# Patient Record
Sex: Female | Born: 1955 | Race: White | Hispanic: No | Marital: Married
Health system: Southern US, Community
[De-identification: ages and names within clinical notes are randomized; demographics above are authoritative.]

---

## 2005-02-23 ENCOUNTER — Ambulatory Visit: Payer: Self-pay | Admitting: Emergency Medicine

## 2005-02-23 ENCOUNTER — Emergency Department: Payer: Self-pay | Admitting: Emergency Medicine

## 2006-07-20 ENCOUNTER — Ambulatory Visit (HOSPITAL_COMMUNITY): Admission: RE | Admit: 2006-07-20 | Discharge: 2006-07-20 | Payer: Self-pay | Admitting: *Deleted

## 2006-09-03 ENCOUNTER — Observation Stay (HOSPITAL_COMMUNITY): Admission: EM | Admit: 2006-09-03 | Discharge: 2006-09-04 | Payer: Self-pay | Admitting: Emergency Medicine

## 2006-09-12 ENCOUNTER — Ambulatory Visit: Payer: Self-pay | Admitting: Gastroenterology

## 2006-09-13 ENCOUNTER — Observation Stay (HOSPITAL_COMMUNITY): Admission: RE | Admit: 2006-09-13 | Discharge: 2006-09-14 | Payer: Self-pay | Admitting: Cardiology

## 2007-01-11 ENCOUNTER — Inpatient Hospital Stay (HOSPITAL_COMMUNITY): Admission: EM | Admit: 2007-01-11 | Discharge: 2007-01-12 | Payer: Self-pay | Admitting: Emergency Medicine

## 2007-02-04 ENCOUNTER — Inpatient Hospital Stay (HOSPITAL_COMMUNITY): Admission: EM | Admit: 2007-02-04 | Discharge: 2007-02-06 | Payer: Self-pay | Admitting: Emergency Medicine

## 2007-02-04 ENCOUNTER — Ambulatory Visit: Payer: Self-pay | Admitting: Internal Medicine

## 2007-05-04 ENCOUNTER — Ambulatory Visit: Payer: Self-pay | Admitting: Emergency Medicine

## 2007-06-27 ENCOUNTER — Inpatient Hospital Stay: Payer: Self-pay | Admitting: Cardiology

## 2007-07-15 ENCOUNTER — Ambulatory Visit: Payer: Self-pay | Admitting: Emergency Medicine

## 2007-07-27 ENCOUNTER — Inpatient Hospital Stay: Payer: Self-pay | Admitting: Internal Medicine

## 2007-07-27 ENCOUNTER — Other Ambulatory Visit: Payer: Self-pay

## 2007-08-15 ENCOUNTER — Other Ambulatory Visit: Payer: Self-pay

## 2007-08-16 ENCOUNTER — Inpatient Hospital Stay: Payer: Self-pay | Admitting: Internal Medicine

## 2007-12-04 ENCOUNTER — Ambulatory Visit: Payer: Self-pay | Admitting: Family Medicine

## 2008-01-18 ENCOUNTER — Emergency Department: Payer: Self-pay | Admitting: Emergency Medicine

## 2008-01-18 ENCOUNTER — Other Ambulatory Visit: Payer: Self-pay

## 2008-02-25 ENCOUNTER — Ambulatory Visit: Payer: Self-pay | Admitting: Pain Medicine

## 2008-03-02 ENCOUNTER — Ambulatory Visit: Payer: Self-pay | Admitting: Pain Medicine

## 2008-03-04 ENCOUNTER — Ambulatory Visit: Payer: Self-pay | Admitting: Pain Medicine

## 2008-04-07 ENCOUNTER — Ambulatory Visit: Payer: Self-pay | Admitting: Pain Medicine

## 2008-04-15 ENCOUNTER — Ambulatory Visit: Payer: Self-pay | Admitting: Pain Medicine

## 2008-08-04 ENCOUNTER — Ambulatory Visit: Payer: Self-pay | Admitting: Pain Medicine

## 2008-08-12 ENCOUNTER — Ambulatory Visit: Payer: Self-pay | Admitting: Pain Medicine

## 2008-08-31 ENCOUNTER — Ambulatory Visit: Payer: Self-pay | Admitting: Vascular Surgery

## 2009-08-19 ENCOUNTER — Ambulatory Visit: Payer: Self-pay | Admitting: Vascular Surgery

## 2009-09-03 ENCOUNTER — Ambulatory Visit: Payer: Self-pay | Admitting: Vascular Surgery

## 2009-09-07 ENCOUNTER — Inpatient Hospital Stay: Payer: Self-pay | Admitting: Vascular Surgery

## 2009-09-16 ENCOUNTER — Inpatient Hospital Stay: Payer: Self-pay | Admitting: Vascular Surgery

## 2009-12-30 ENCOUNTER — Inpatient Hospital Stay: Payer: Self-pay | Admitting: Vascular Surgery

## 2010-07-28 ENCOUNTER — Inpatient Hospital Stay: Payer: Self-pay | Admitting: Vascular Surgery

## 2011-01-20 ENCOUNTER — Ambulatory Visit: Payer: Self-pay | Admitting: Vascular Surgery

## 2011-01-26 ENCOUNTER — Inpatient Hospital Stay: Payer: Self-pay | Admitting: Vascular Surgery

## 2011-04-18 ENCOUNTER — Inpatient Hospital Stay: Payer: Self-pay | Admitting: Vascular Surgery

## 2011-04-24 ENCOUNTER — Inpatient Hospital Stay: Payer: Self-pay | Admitting: Vascular Surgery

## 2011-04-28 LAB — PATHOLOGY REPORT

## 2011-07-07 ENCOUNTER — Inpatient Hospital Stay: Payer: Self-pay | Admitting: Vascular Surgery

## 2011-08-02 IMAGING — XA IR VASCULAR PROCEDURE
9 series · 15 of 17 positions shown · IV contrast (IODINE)
Comparison: none

[Series 1: aorta · 2 of 2 slices shown (1 of 8)]
[im 1/2]
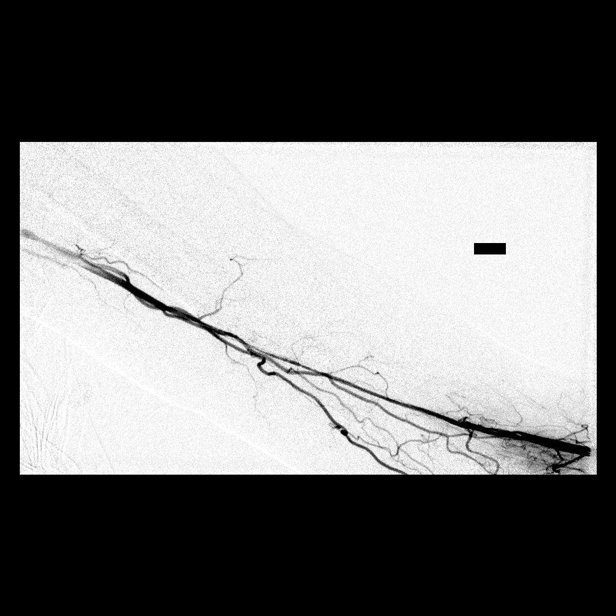
[im 2/2]
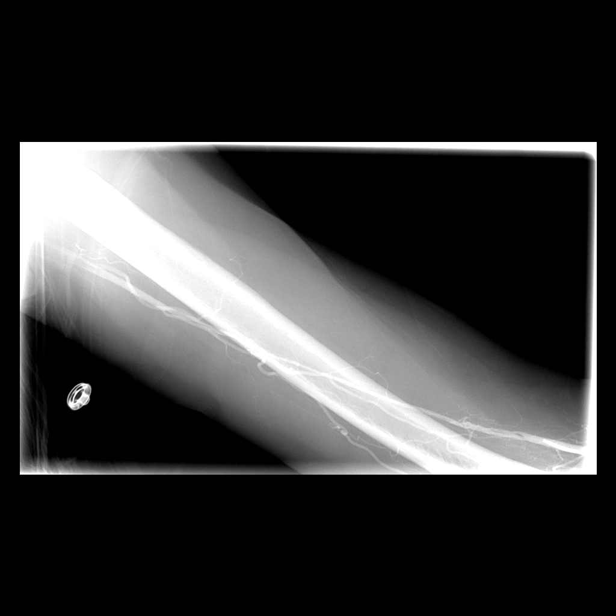

[Series 2: fl  angio · 1 of 1 slices shown]
[im 1/1]
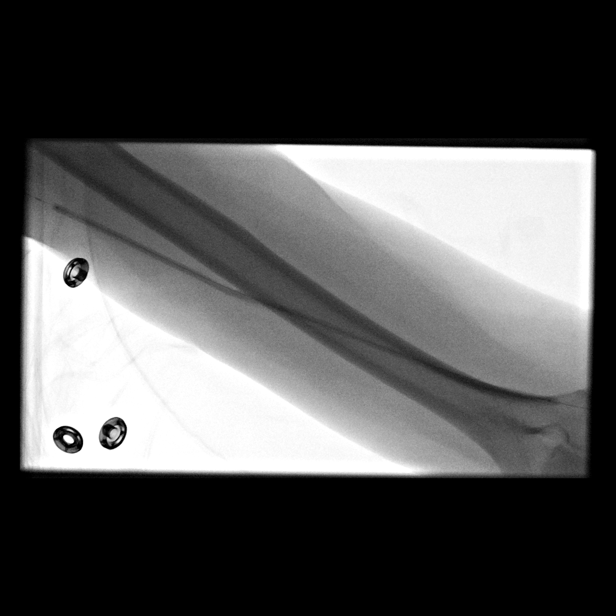

[Series 4: aorta · 1 of 2 slices shown (2 of 8)]
[im 1/2]
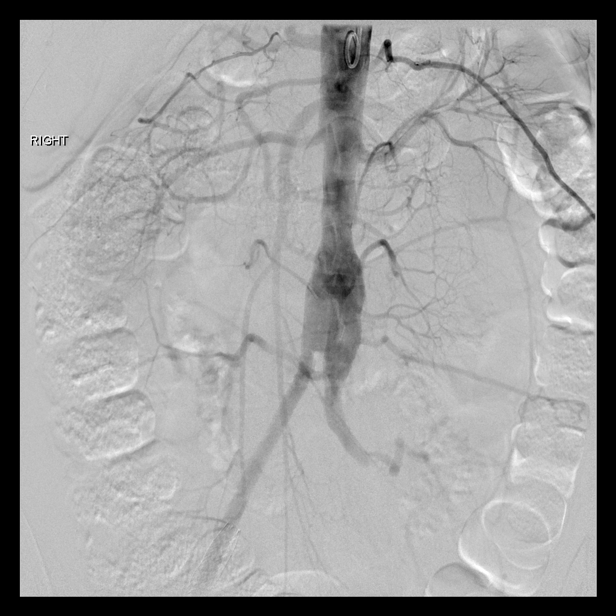

[Series 5: aorta · 2 of 2 slices shown (3 of 8)]
[im 1/2]
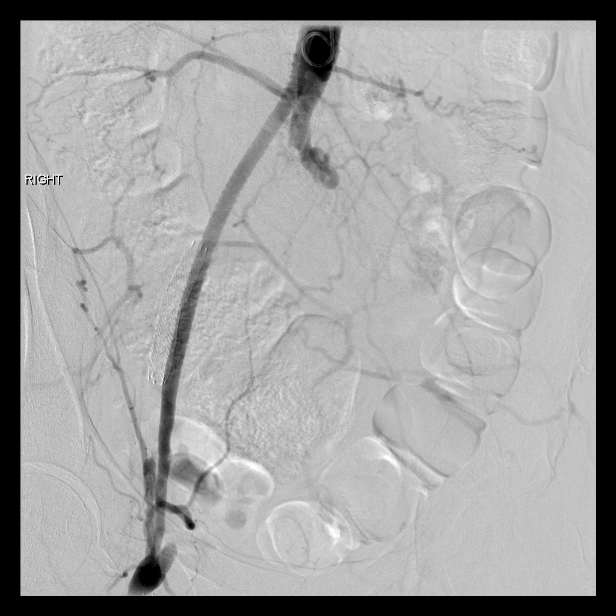
[im 2/2]
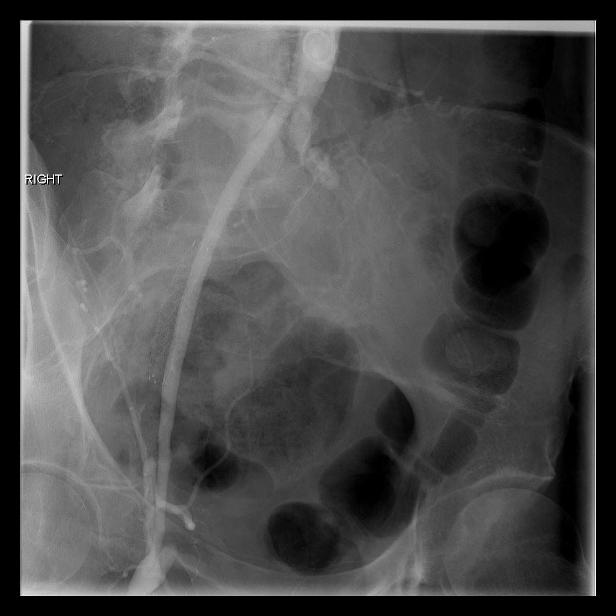

[Series 6: aorta · 2 of 2 slices shown (4 of 8)]
[im 1/2]
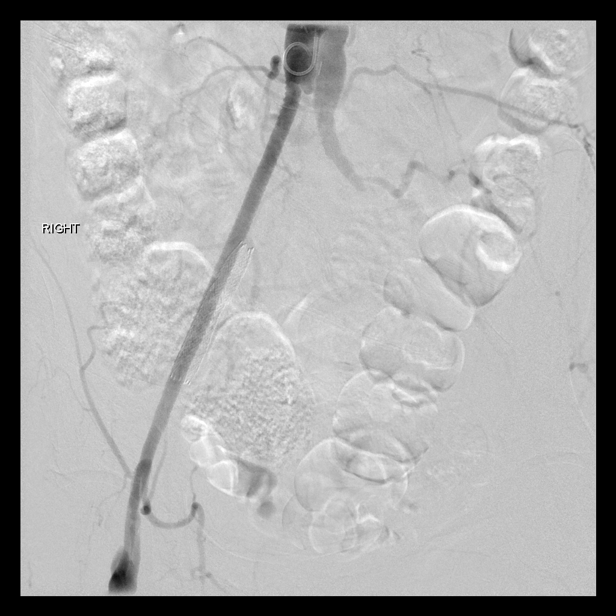
[im 2/2]
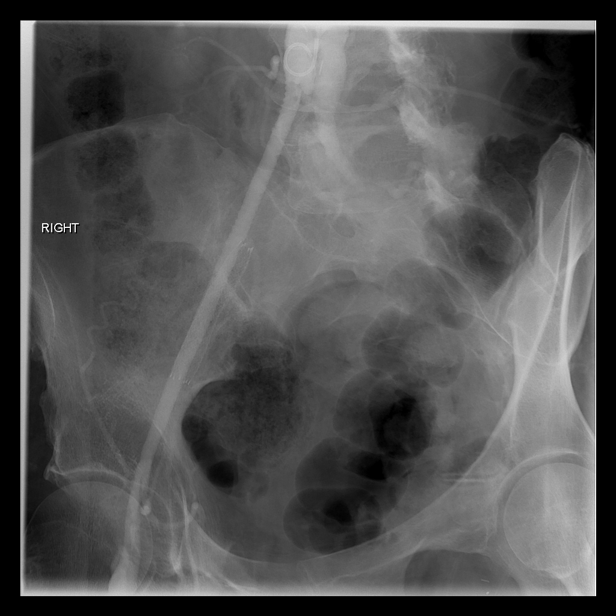

[Series 7: aorta · 2 of 2 slices shown (5 of 8)]
[im 1/2]
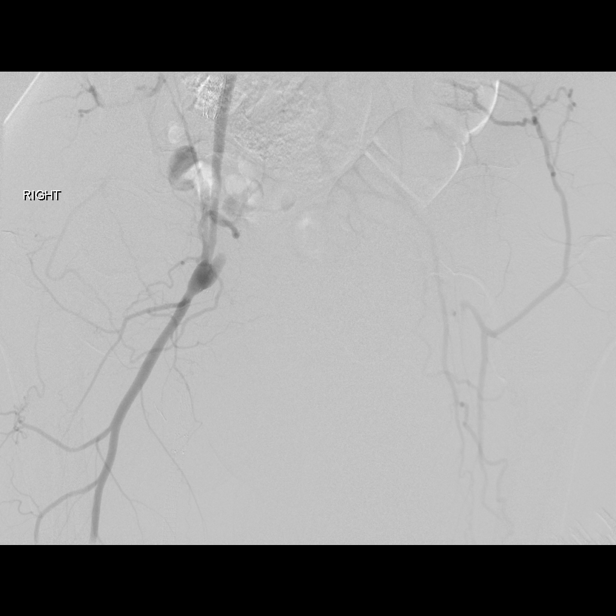
[im 2/2]
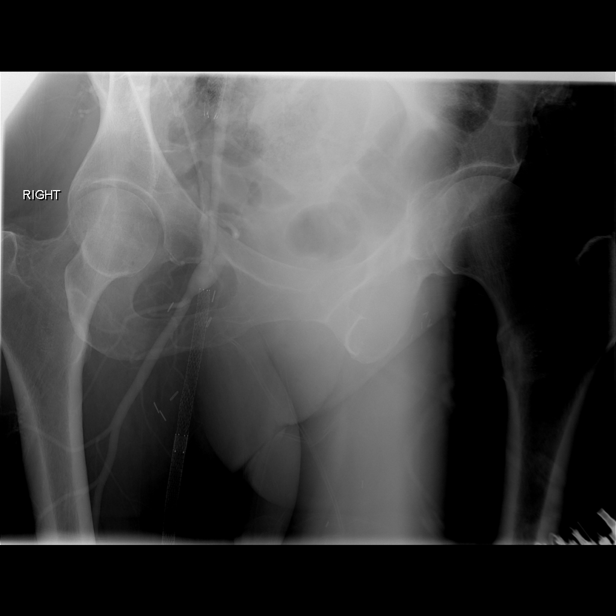

[Series 8: aorta · 1 of 2 slices shown (6 of 8)]
[im 1/2]
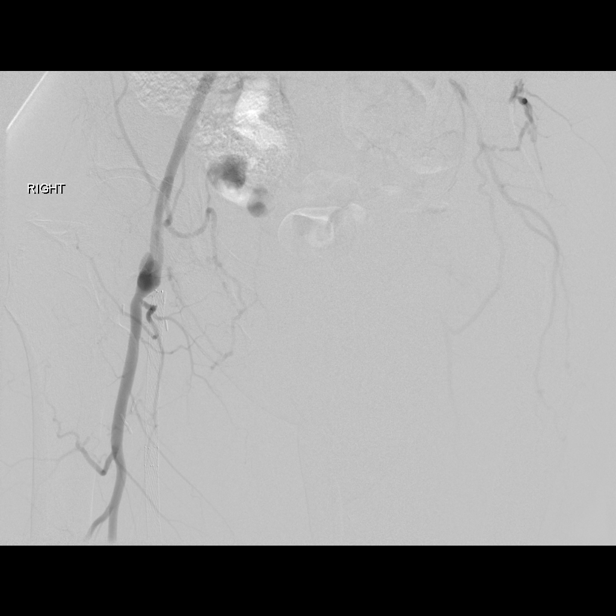

[Series 9: aorta · 2 of 2 slices shown (7 of 8)]
[im 1/2]
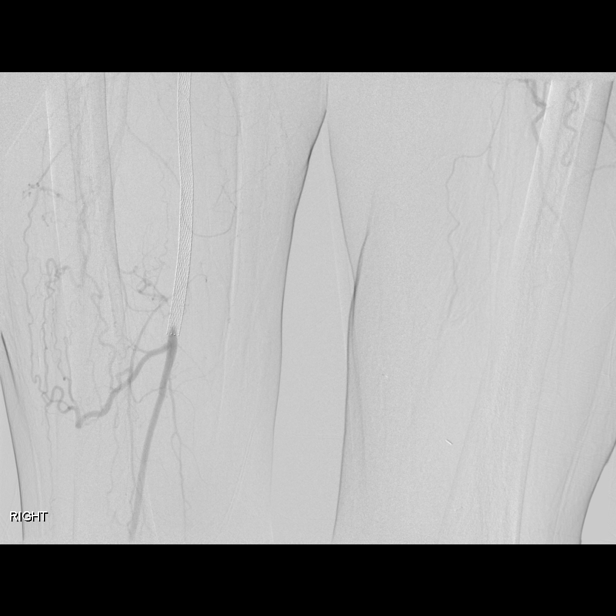
[im 2/2]
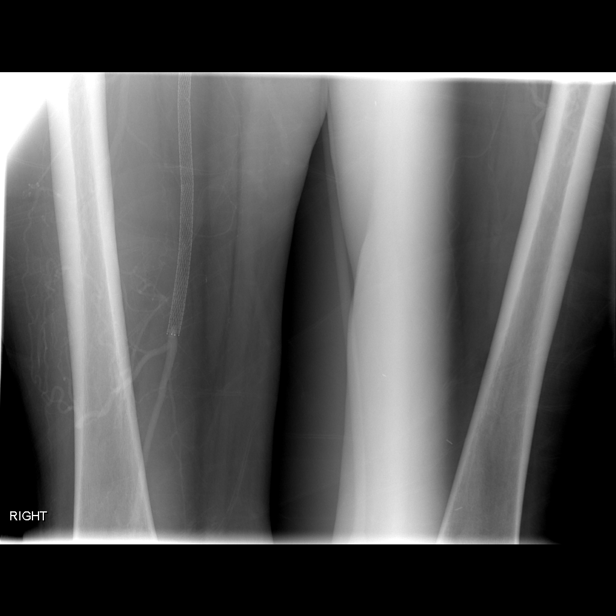

[Series 10: aorta · 2 of 2 slices shown (8 of 8)]
[im 1/2]
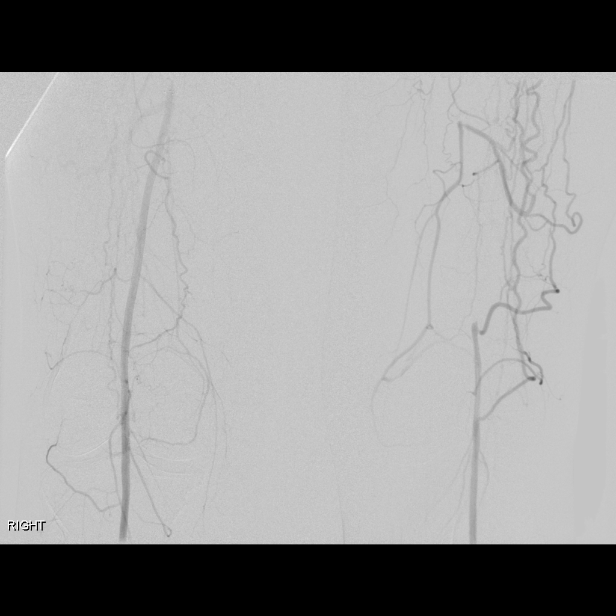
[im 2/2]
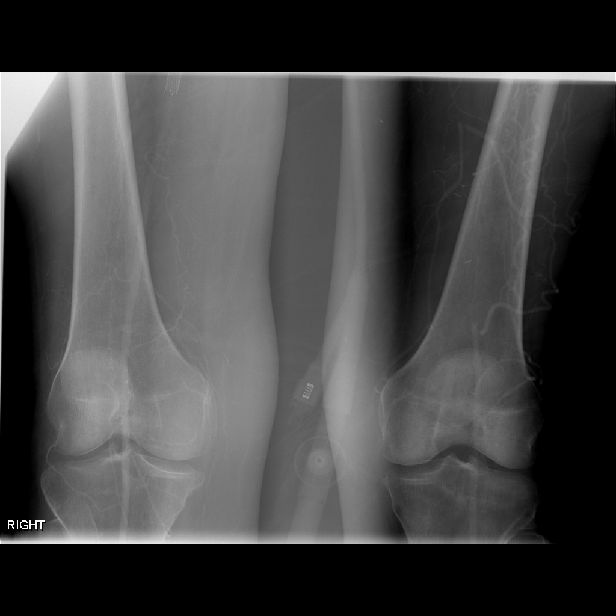

[15 of 17 positions shown; findings below may reference images not displayed]

IMAGES IMPORTED FROM THE [employer] WORKFLOW SYSTEM
NO DICTATION FOR STUDY

## 2012-04-28 IMAGING — CR DG CHEST 1V PORT
1 series · 1 of 1 positions shown · non-contrast
Comparison: none

REASON FOR EXAM: check central line placement
COMMENTS:

PROCEDURE:     DXR - DXR PORTABLE CHEST SINGLE VIEW  - April 25, 2011  [DATE]
RESULT:     Comparison: None

[view not recorded]
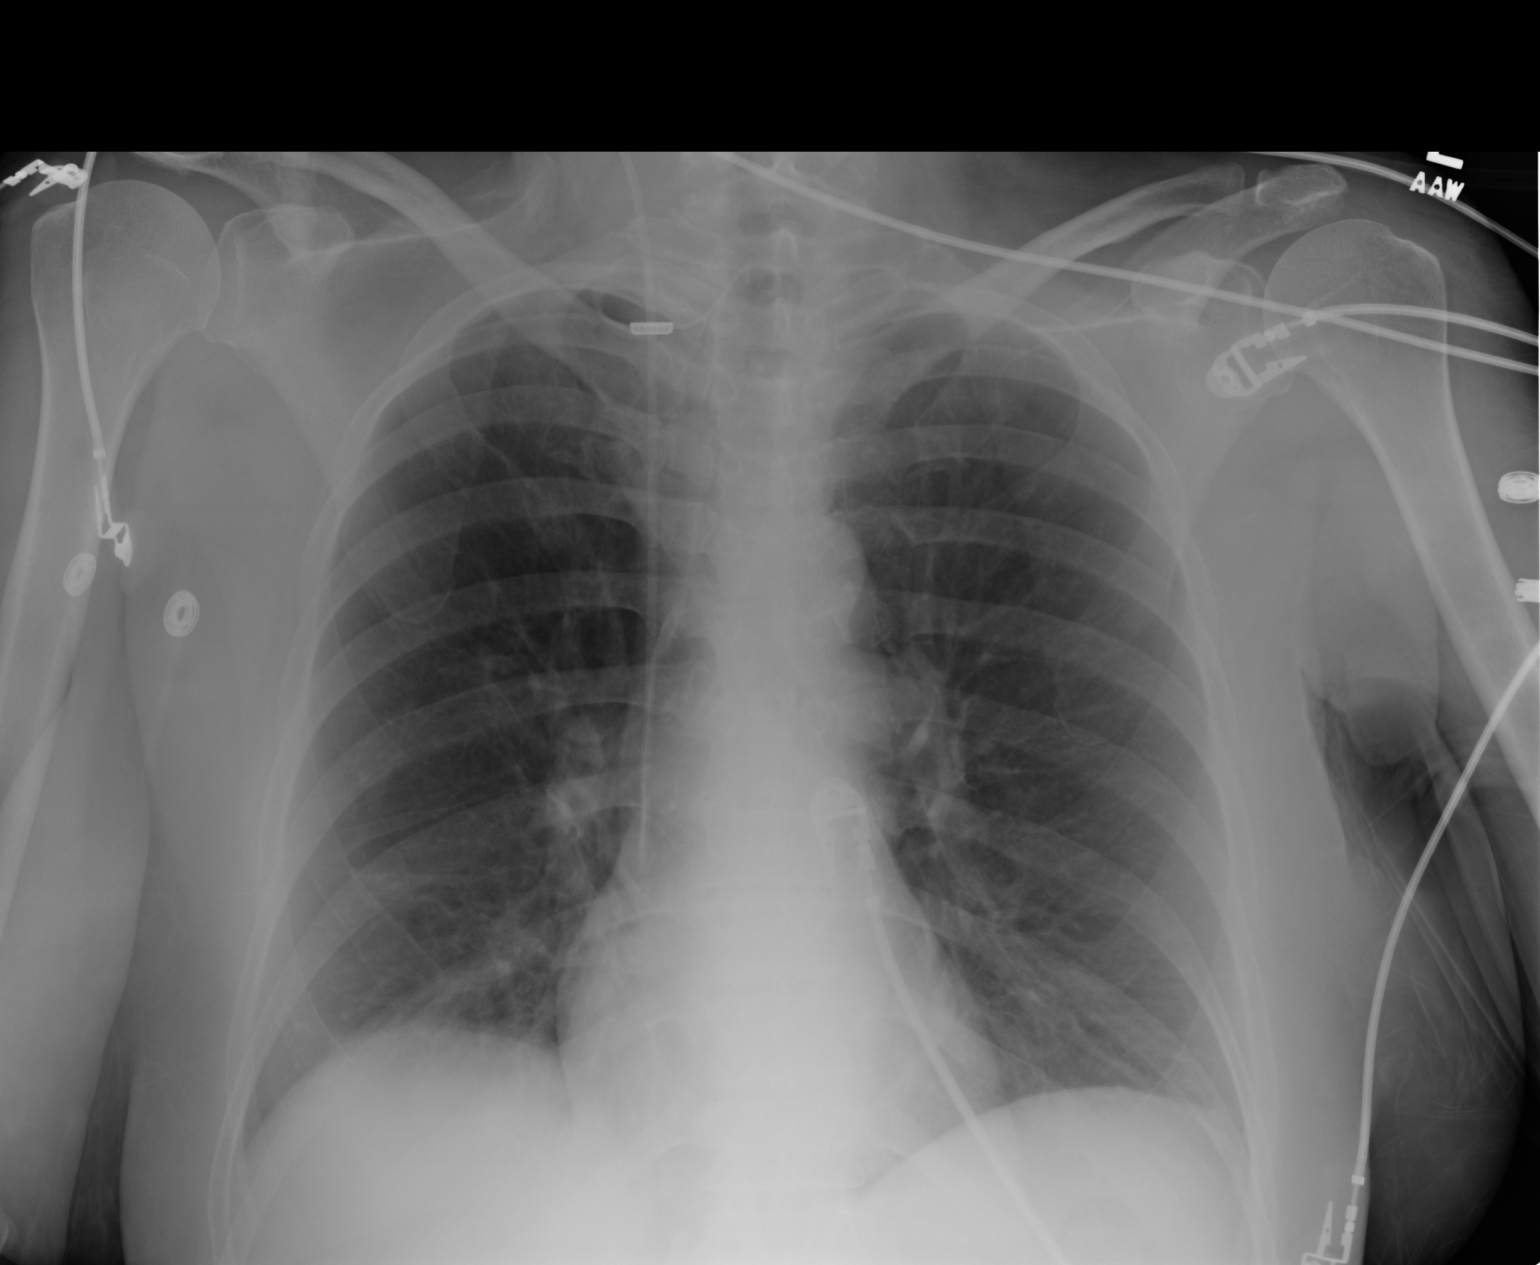

[1 of 1 positions shown; findings below may reference images not displayed]

FINDINGS: Single portable AP chest radiograph is provided. There is a right-sided
central venous catheter with the tip projecting over the SVC. There is no
focal parenchymal opacity, pleural effusion, or pneumothorax. Normal
cardiomediastinal silhouette. The osseous structures are unremarkable.
IMPRESSION: Right-sided central venous catheter in satisfactory position.

## 2014-06-07 ENCOUNTER — Emergency Department: Payer: Self-pay | Admitting: Internal Medicine

## 2014-06-07 LAB — COMPREHENSIVE METABOLIC PANEL
ALBUMIN: 3.5 g/dL (ref 3.4–5.0)
ALK PHOS: 98 U/L
ALT: 15 U/L (ref 12–78)
ANION GAP: 3 — AB (ref 7–16)
AST: 22 U/L (ref 15–37)
BUN: 7 mg/dL (ref 7–18)
Bilirubin,Total: 0.7 mg/dL (ref 0.2–1.0)
CALCIUM: 9.1 mg/dL (ref 8.5–10.1)
CO2: 31 mmol/L (ref 21–32)
Chloride: 107 mmol/L (ref 98–107)
Creatinine: 0.98 mg/dL (ref 0.60–1.30)
EGFR (African American): >60
EGFR (Non-African Amer.): >60
GLUCOSE: 102 mg/dL — AB (ref 65–99)
Osmolality: 279 (ref 275–301)
POTASSIUM: 4 mmol/L (ref 3.5–5.1)
SODIUM: 141 mmol/L (ref 136–145)
Total Protein: 7.4 g/dL (ref 6.4–8.2)

## 2014-06-07 LAB — CBC WITH DIFFERENTIAL/PLATELET
Basophil #: 0 10*3/uL (ref 0.0–0.1)
Basophil %: 0.6 %
Eosinophil #: 0 10*3/uL (ref 0.0–0.7)
Eosinophil %: 0.3 %
HCT: 46.7 % (ref 35.0–47.0)
HGB: 15.5 g/dL (ref 12.0–16.0)
Lymphocyte #: 1 10*3/uL (ref 1.0–3.6)
Lymphocyte %: 13.6 %
MCH: 31.3 pg (ref 26.0–34.0)
MCHC: 33.2 g/dL (ref 32.0–36.0)
MCV: 94 fL (ref 80–100)
MONOS PCT: 8.7 %
Monocyte #: 0.7 x10 3/mm (ref 0.2–0.9)
NEUTROS ABS: 5.9 10*3/uL (ref 1.4–6.5)
Neutrophil %: 76.8 %
Platelet: 145 10*3/uL — ABNORMAL LOW (ref 150–440)
RBC: 4.95 10*6/uL (ref 3.80–5.20)
RDW: 13.3 % (ref 11.5–14.5)
WBC: 7.6 10*3/uL (ref 3.6–11.0)

## 2014-06-07 LAB — TROPONIN I

## 2014-06-07 LAB — PROTIME-INR
INR: 2.1
PROTHROMBIN TIME: 23 s — AB (ref 11.5–14.7)

## 2014-06-07 LAB — APTT: Activated PTT: 45.6 secs — ABNORMAL HIGH (ref 23.6–35.9)

## 2014-08-16 ENCOUNTER — Emergency Department: Payer: Self-pay | Admitting: Emergency Medicine

## 2014-11-20 DEATH — deceased

## 2015-06-11 IMAGING — CT CT HEAD WITHOUT CONTRAST
1 series · 16 of 30 positions shown, 20 images · non-contrast
Comparison: CT scan of July 27, 2007.

CLINICAL DATA: Right-sided weakness.

EXAM:
CT HEAD WITHOUT CONTRAST
TECHNIQUE: Contiguous axial images were obtained from the base of the skull
through the vertex without intravenous contrast.

[Series 2: head wo · axial · 0.45mm/px · z∈[-168,-42]mm · 16 of 32 slices shown, 20 images]
[im 2/32  brain]
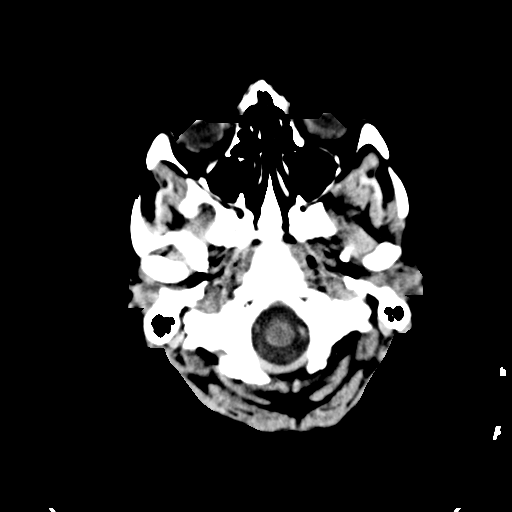
[im 2/32  bone]
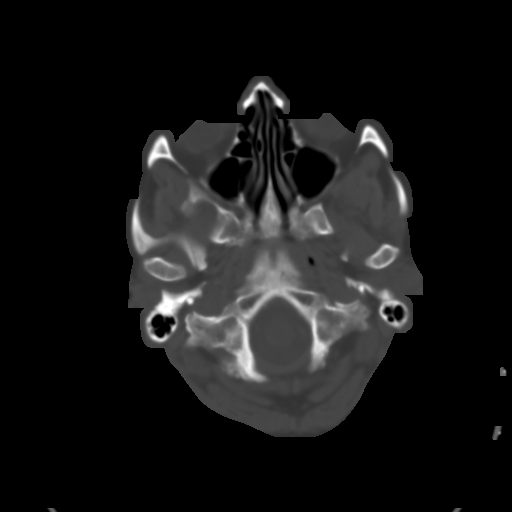
[im 4/32  brain]
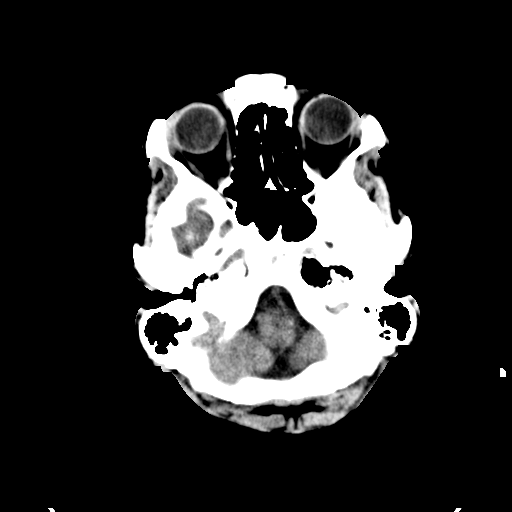
[im 6/32  brain]
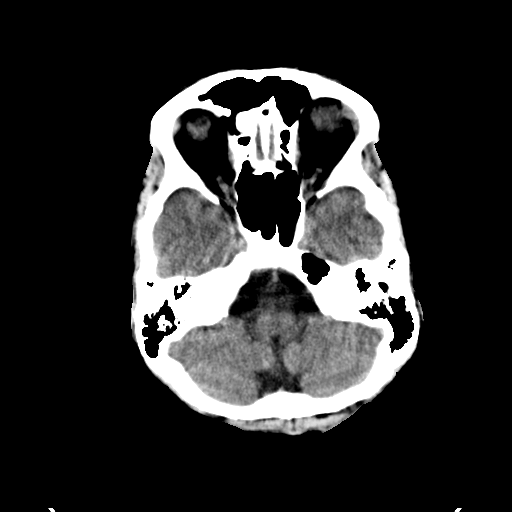
[im 8/32  brain]
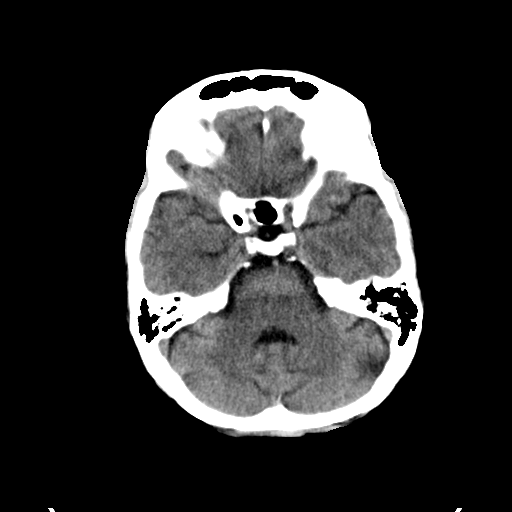
[im 9/32  brain]
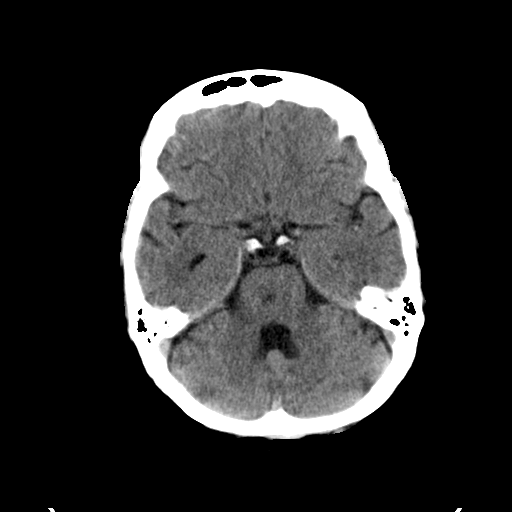
[im 9/32  bone]
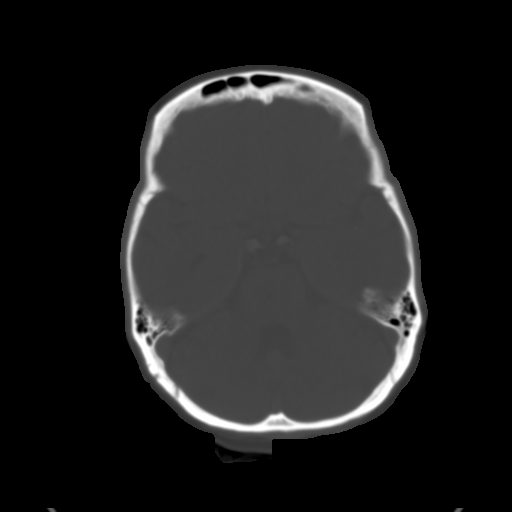
[im 11/32  brain]
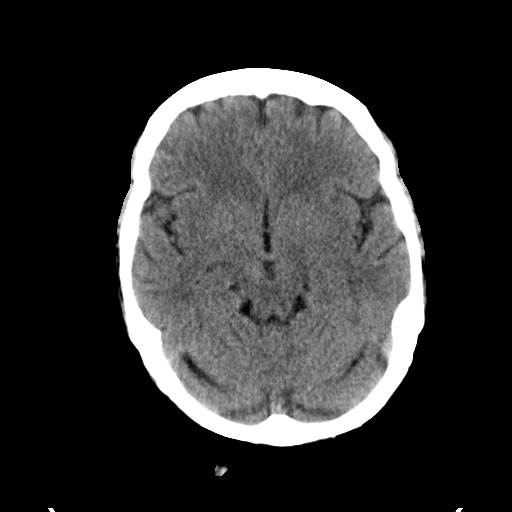
[im 13/32  brain]
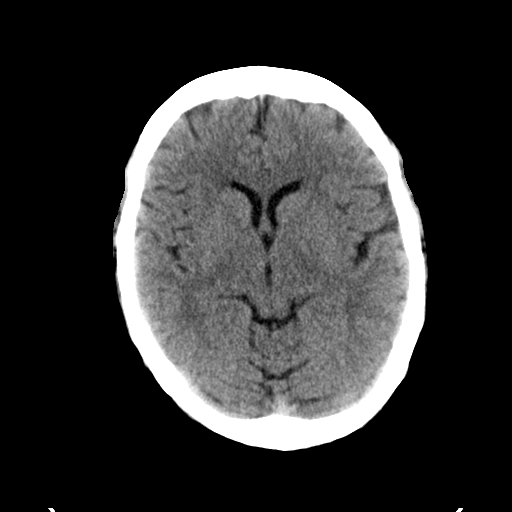
[im 15/32  brain]
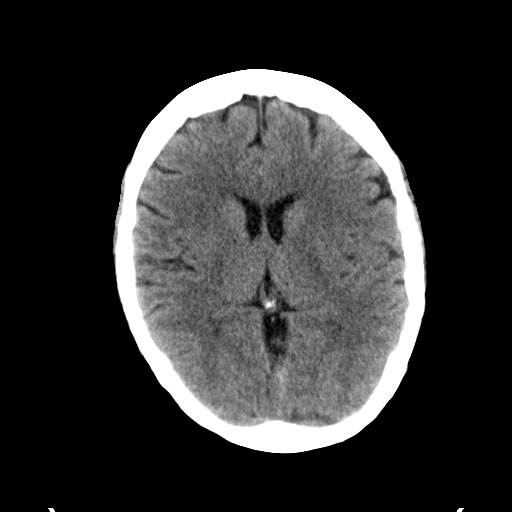
[im 17/32  brain]
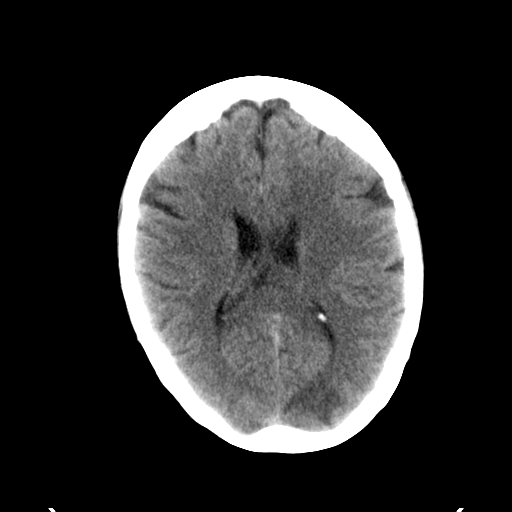
[im 17/32  bone]
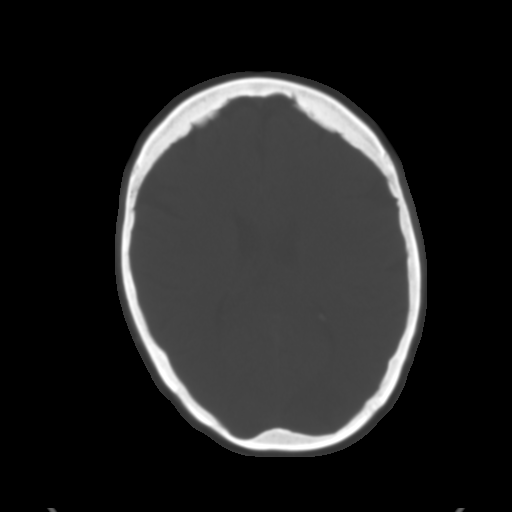
[im 19/32  brain]
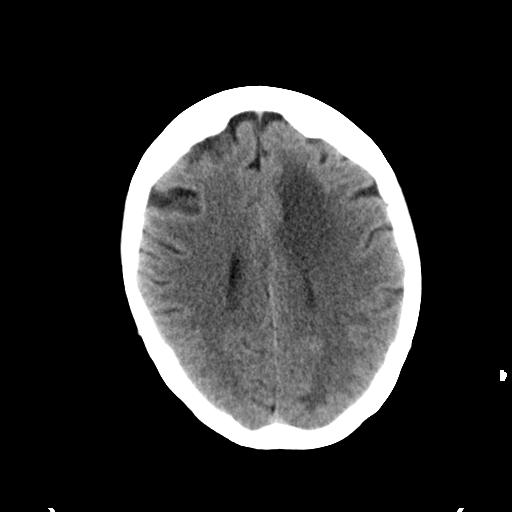
[im 21/32  brain]
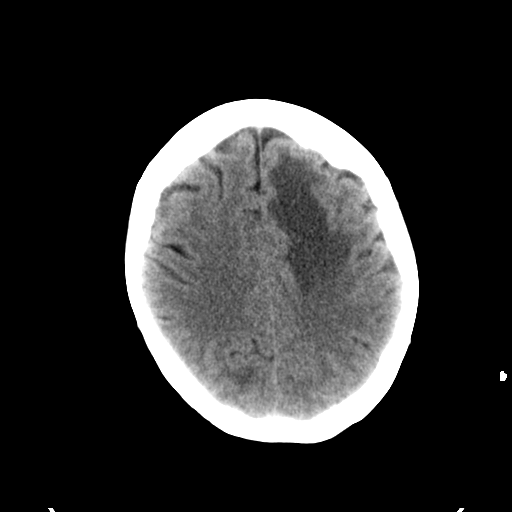
[im 23/32  brain]
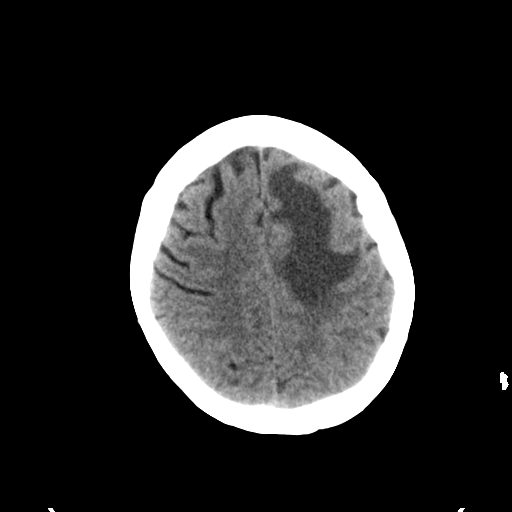
[im 24/32  brain]
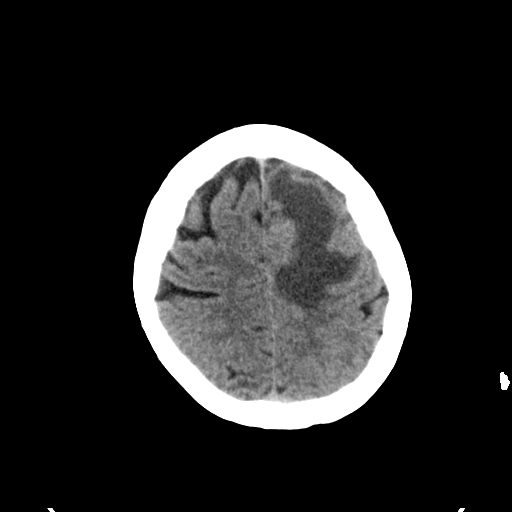
[im 24/32  bone]
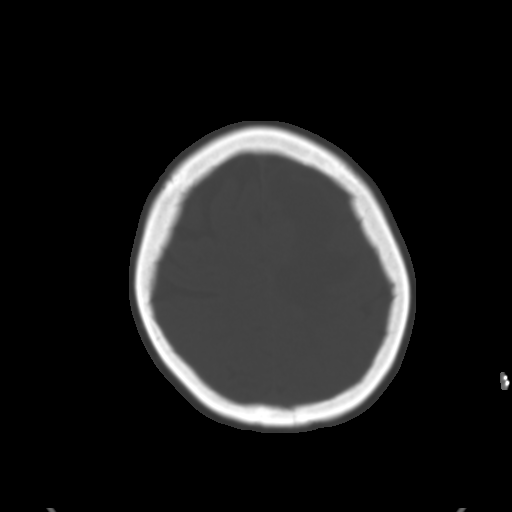
[im 26/32  brain]
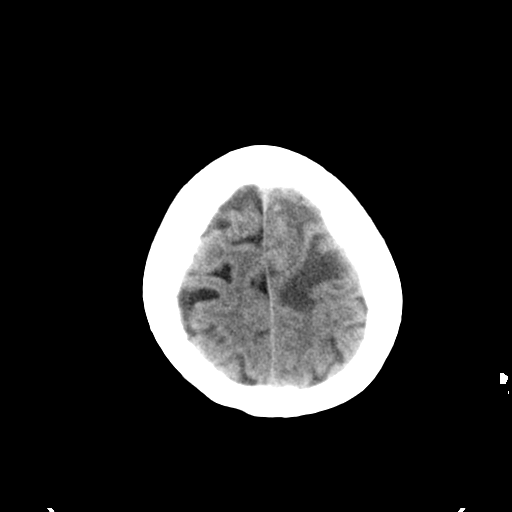
[im 28/32  brain]
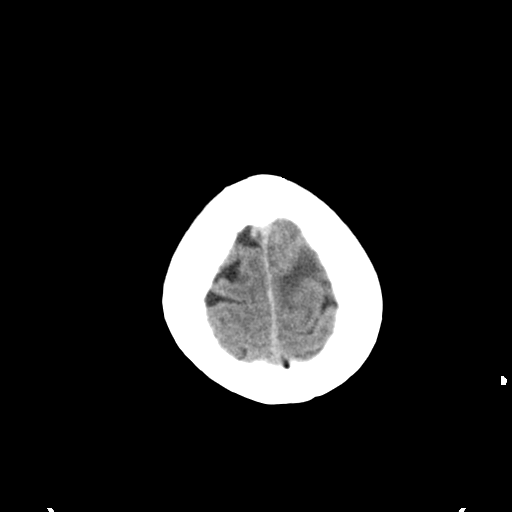
[im 30/32  brain]
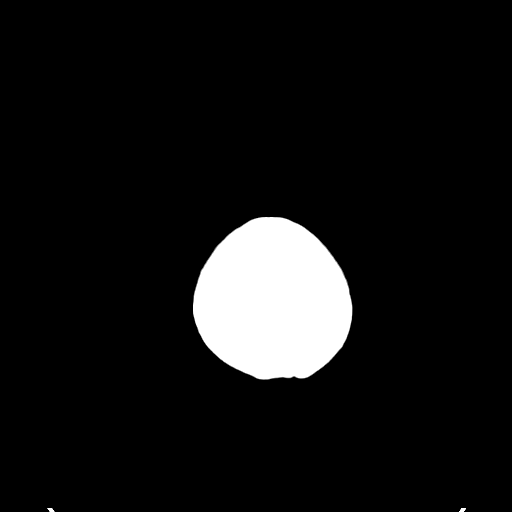

[16 of 30 positions shown; findings below may reference images not displayed]

FINDINGS: Bony calvarium appears intact. Ventricular size is within normal
limits. No midline shift is noted. Large subcortical white matter
low density is noted in the left frontal and parietal cortex
measuring 6.3 x 3.6 cm ; this most likely is due to underlying
neoplasm, but infarction cannot be excluded. Similar but smaller
area is seen in the left occipital lobe. No definite hemorrhage is
noted.
IMPRESSION: Large area of subcortical white matter low density is noted in the
left frontal and parietal cortex most consistent with underlying
neoplasm, but infarction cannot be excluded. Similar but smaller
area seen in left occipital lobe. Further evaluation with MRI with
gadolinium is recommended.

## 2016-01-07 ENCOUNTER — Other Ambulatory Visit: Payer: Self-pay | Admitting: *Deleted

## 2016-01-07 NOTE — Patient Outreach (Signed)
[facility]) Care Management  01/07/2016  Catherine Duke [birth year] [id number]   Subjective: Telephone call to patient's home number, no answer, left HIPAA compliant voice mail message, and requested call back.   Objective: Per Epic case review, no ED visits or inpatients admits in 2016.    Assessment:  RNCM verified with Catherine Duke at [facility] Care Management, patient is not eligible for [facility] Care Management   Plan:  [name] will advise patient not eligible for [facility] Care Management services if return call from patient.  Catherine Duke, BSN, CCM [facility] Care Management [facility] Telephonic CM Phone: [phone] Fax: [phone]

## 2016-01-12 ENCOUNTER — Other Ambulatory Visit: Payer: Self-pay | Admitting: *Deleted

## 2016-01-12 NOTE — Patient Outreach (Addendum)
[facility]) Care Management  01/12/2016  Catherine Duke 05-14-1956 [id number]   Received telephone call from patient's mother Ms. Marga Hoots, HIPAA verified, and states that patient is now deceased.  Mother states patient died in Dec 10, 2015.    Telephone call to Damita Rhodie at [facility] Care Management, advised of above status of update.   Yosselin Zoeller H. Gardiner Barefoot, BSN, CCM [facility] Care Management [facility] Telephonic CM Phone: [phone] Fax: [phone]
